# Patient Record
Sex: Female | Born: 1955 | Race: White | Hispanic: No | Marital: Single | Smoking: Never smoker
Health system: Southern US, Community
[De-identification: ages and names within clinical notes are randomized; demographics above are authoritative.]

## PROBLEM LIST (undated history)

## (undated) HISTORY — PX: TONSILLECTOMY: SUR1361

## (undated) HISTORY — PX: KNEE SURGERY: SHX244

## (undated) HISTORY — PX: WRIST SURGERY: SHX841

---

## 2020-12-03 ENCOUNTER — Emergency Department (HOSPITAL_BASED_OUTPATIENT_CLINIC_OR_DEPARTMENT_OTHER): Payer: 59

## 2020-12-03 ENCOUNTER — Other Ambulatory Visit: Payer: Self-pay

## 2020-12-03 ENCOUNTER — Encounter (HOSPITAL_BASED_OUTPATIENT_CLINIC_OR_DEPARTMENT_OTHER): Payer: Self-pay | Admitting: *Deleted

## 2020-12-03 ENCOUNTER — Emergency Department (HOSPITAL_BASED_OUTPATIENT_CLINIC_OR_DEPARTMENT_OTHER)
Admission: EM | Admit: 2020-12-03 | Discharge: 2020-12-03 | Disposition: A | Discharge status: Home / Self Care | Payer: 59 | Attending: Emergency Medicine | Admitting: Emergency Medicine

## 2020-12-03 DIAGNOSIS — W19XXXA Unspecified fall, initial encounter: Secondary | ICD-10-CM | POA: Diagnosis not present

## 2020-12-03 DIAGNOSIS — S60032A Contusion of left middle finger without damage to nail, initial encounter: Secondary | ICD-10-CM | POA: Insufficient documentation

## 2020-12-03 DIAGNOSIS — U071 COVID-19: Secondary | ICD-10-CM | POA: Insufficient documentation

## 2020-12-03 DIAGNOSIS — R55 Syncope and collapse: Secondary | ICD-10-CM | POA: Diagnosis present

## 2020-12-03 DIAGNOSIS — Y92002 Bathroom of unspecified non-institutional (private) residence single-family (private) house as the place of occurrence of the external cause: Secondary | ICD-10-CM | POA: Diagnosis not present

## 2020-12-03 DIAGNOSIS — S60022A Contusion of left index finger without damage to nail, initial encounter: Secondary | ICD-10-CM | POA: Diagnosis not present

## 2020-12-03 LAB — RESP PANEL BY RT-PCR (FLU A&B, COVID) ARPGX2
Influenza A by PCR: NEGATIVE
Influenza B by PCR: NEGATIVE
SARS Coronavirus 2 by RT PCR: POSITIVE — AB

## 2020-12-03 LAB — URINALYSIS, ROUTINE W REFLEX MICROSCOPIC
Bilirubin Urine: NEGATIVE
Glucose, UA: NEGATIVE mg/dL
Hgb urine dipstick: NEGATIVE
Ketones, ur: NEGATIVE mg/dL
Leukocytes,Ua: NEGATIVE
Nitrite: NEGATIVE
Protein, ur: NEGATIVE mg/dL
Specific Gravity, Urine: 1.005 (ref 1.005–1.030)
pH: 8.5 — ABNORMAL HIGH (ref 5.0–8.0)

## 2020-12-03 LAB — CBC
HCT: 42.6 % (ref 36.0–46.0)
Hemoglobin: 14 g/dL (ref 12.0–15.0)
MCH: 31.7 pg (ref 26.0–34.0)
MCHC: 32.9 g/dL (ref 30.0–36.0)
MCV: 96.4 fL (ref 80.0–100.0)
Platelets: 226 10*3/uL (ref 150–400)
RBC: 4.42 MIL/uL (ref 3.87–5.11)
RDW: 13.1 % (ref 11.5–15.5)
WBC: 9.1 10*3/uL (ref 4.0–10.5)
nRBC: 0 % (ref 0.0–0.2)

## 2020-12-03 LAB — TRIGLYCERIDES: Triglycerides: 40 mg/dL (ref <150)

## 2020-12-03 LAB — BASIC METABOLIC PANEL
Anion gap: 7 (ref 5–15)
BUN: 16 mg/dL (ref 8–23)
CO2: 27 mmol/L (ref 22–32)
Calcium: 8.8 mg/dL — ABNORMAL LOW (ref 8.9–10.3)
Chloride: 106 mmol/L (ref 98–111)
Creatinine, Ser: 0.88 mg/dL (ref 0.44–1.00)
GFR, Estimated: >60 mL/min (ref >60)
Glucose, Bld: 121 mg/dL — ABNORMAL HIGH (ref 70–99)
Potassium: 3.8 mmol/L (ref 3.5–5.1)
Sodium: 140 mmol/L (ref 135–145)

## 2020-12-03 LAB — FERRITIN: Ferritin: 68 ng/mL (ref 11–307)

## 2020-12-03 LAB — LACTATE DEHYDROGENASE: LDH: 167 U/L (ref 98–192)

## 2020-12-03 LAB — FIBRINOGEN: Fibrinogen: 417 mg/dL (ref 210–475)

## 2020-12-03 LAB — C-REACTIVE PROTEIN: CRP: 0.7 mg/dL (ref <1.0)

## 2020-12-03 LAB — D-DIMER, QUANTITATIVE: D-Dimer, Quant: 0.45 ug/mL-FEU (ref 0.00–0.50)

## 2020-12-03 LAB — PROCALCITONIN: Procalcitonin: <0.1 ng/mL

## 2020-12-03 NOTE — ED Notes (Signed)
+  COVID, results given to ED MD and Myia RN

## 2020-12-03 NOTE — ED Provider Notes (Signed)
F/u d-dimer, if negative, stable for discharge. Physical Exam  BP 109/78 (BP Location: Right Arm)   Pulse 80   Temp 99.4 F (37.4 C) (Oral)   Resp 20   Ht 5\' 5"  (1.651 m)   Wt 79.4 kg   SpO2 99%   BMI 29.12 kg/m   Physical Exam  ED Course/Procedures     Procedures  MDM  Patient alert and nontoxic.  No respiratory distress.  Normal vital signs.  No hypotension, hypoxia or tachycardia.  Patient does not have elevation in inflammatory markers to suggest more severe COVID.  At this time recommendations made for home treatment.  Message placed for remdesivir clinic.  Patient does not appear to have significant risk factors other than age.  She does report having had COVID December a year ago.  She is not vaccinated.       January, MD 12/03/20 [phone]

## 2020-12-03 NOTE — ED Provider Notes (Addendum)
[facility] EMERGENCY DEPARTMENT Provider Note   CSN: [id number] Arrival date & time: 12/03/20  0272     History Chief Complaint  Patient presents with   Angela Lor    Angela Kline is a 65 y.o. female.    65 year old female comes in with chief complaint of fall and questionable syncope. She has no medical history but does not see any physicians.  She reports having COVID-19 in December, 2020.   Earlier this morning patient fell in her bathroom.  She reports that she had urinated, and then washing her face -and the next thing she recalls she was on the floor.  From the fall itself she is having pain to her head, neck and hand.  She denies any numbness, tingling.  She has ambulated with mild discomfort over the hips.  Patient is not vaccinated.  Upon further history she reports that she had some sore throat, chills few days ago.  No history of PE, DVT.  No history of chest pain, palpitations.  Patient does not take any blood thinners.   History reviewed. No pertinent past medical history.  There are no problems to display for this patient.   Past Surgical History:  Procedure Laterality Date   KNEE SURGERY     TONSILLECTOMY     WRIST SURGERY Bilateral      OB History   No obstetric history on file.     History reviewed. No pertinent family history.  Social History   Tobacco Use   Smoking status: Never Smoker   Smokeless tobacco: Never Used  Substance Use Topics   Alcohol use: Not Currently   Drug use: Never    Home Medications Prior to Admission medications   Not on File    Allergies    Morphine and related and Penicillins  Review of Systems   Review of Systems  Constitutional: Positive for activity change and diaphoresis.  Respiratory: Negative for shortness of breath.   Cardiovascular: Negative for chest pain.  Allergic/Immunologic: Negative for immunocompromised state.  Neurological: Positive for syncope and headaches. Negative for  seizures.  Hematological: Does not bruise/bleed easily.  All other systems reviewed and are negative.   Physical Exam Updated Vital Signs BP 121/65    Pulse 67    Temp 99.4 F (37.4 C) (Oral)    Resp 20    Ht 5\' 5"  (1.651 m)    Wt 79.4 kg    SpO2 98%    BMI 29.12 kg/m   Physical Exam Vitals and nursing note reviewed.  Constitutional:      Appearance: She is well-developed.  HENT:     Head: Normocephalic and atraumatic.  Eyes:     Extraocular Movements: EOM normal.  Cardiovascular:     Rate and Rhythm: Normal rate.  Pulmonary:     Effort: Pulmonary effort is normal.  Abdominal:     General: Bowel sounds are normal.  Musculoskeletal:        General: Tenderness present. No swelling or deformity.     Cervical back: Normal range of motion and neck supple.     Comments: Tenderness over the second and third digit of the left hand, mild ecchymosis.  No deformity, patient is able to fire the intrinsic and extrinsic muscles of the hand without difficulty.  Skin:    General: Skin is warm and dry.  Neurological:     Mental Status: She is alert and oriented to person, place, and time.     ED Results / Procedures /  Treatments   Labs (all labs ordered are listed, but only abnormal results are displayed) Labs Reviewed  RESP PANEL BY RT-PCR (FLU A&B, COVID) ARPGX2 - Abnormal; Notable for the following components:      Result Value   SARS Coronavirus 2 by RT PCR POSITIVE (*)    All other components within normal limits  BASIC METABOLIC PANEL - Abnormal; Notable for the following components:   Glucose, Bld 121 (*)    Calcium 8.8 (*)    All other components within normal limits  URINALYSIS, ROUTINE W REFLEX MICROSCOPIC - Abnormal; Notable for the following components:   pH 8.5 (*)    All other components within normal limits  CBC  PROCALCITONIN  LACTATE DEHYDROGENASE  FERRITIN  TRIGLYCERIDES  C-REACTIVE PROTEIN  D-DIMER, QUANTITATIVE (NOT AT [facility])  FIBRINOGEN    EKG EKG  Interpretation  Date/Time:  Saturday December 03 2020 14:46:31 EST Ventricular Rate:  72 PR Interval:    QRS Duration: 94 QT Interval:  420 QTC Calculation: 460 R Axis:   52 Text Interpretation: Sinus rhythm Repol abnrm suggests ischemia, anterolateral No acute changes No significant change since last tracing Confirmed by Derwood Kaplan ([id number]) on 12/04/2020 3:30:08 PM   Radiology CT Head Wo Contrast  Result Date: 12/03/2020 CLINICAL DATA:  Fall this morning with hematoma to right eye. EXAM: CT HEAD WITHOUT CONTRAST CT CERVICAL SPINE WITHOUT CONTRAST TECHNIQUE: Multidetector CT imaging of the head and cervical spine was performed following the standard protocol without intravenous contrast. Multiplanar CT image reconstructions of the cervical spine were also generated. COMPARISON:  None. FINDINGS: CT HEAD FINDINGS Brain: Ventricles, cisterns and other CSF spaces are normal. There is no mass, mass effect, shift of midline structures or acute hemorrhage. No evidence of acute infarction. Vascular: No hyperdense vessel or unexpected calcification. Skull: Normal. Negative for fracture or focal lesion. Sinuses/Orbits: No acute finding. Other: Well-defined oval 2.3 cm mass over the high right posterior parietal scalp likely benign dermatologic lesion CT CERVICAL SPINE FINDINGS Alignment: Normal. Skull base and vertebrae: Vertebral body heights are normal. There is mild spondylosis throughout the cervical spine to include facet arthropathy and uncovertebral joint spurring. Atlantoaxial articulation is within normal. There is no evidence of acute fracture or traumatic subluxation. Soft tissues and spinal canal: No prevertebral fluid or swelling. No visible canal hematoma. Disc levels: Mild disc space narrowing at the C6-7 level and to lesser degree at the C3-4 level. Upper chest: No acute findings. Other: None. IMPRESSION: 1. No acute brain injury. 2. No acute cervical spine injury. 3. Mild spondylosis of the  cervical spine with mild disc disease at the C6-7 and C3-4 levels. Electronically Signed   By: Elberta Fortis M.D.   On: 12/03/2020 14:16   CT Cervical Spine Wo Contrast  Result Date: 12/03/2020 CLINICAL DATA:  Fall this morning with hematoma to right eye. EXAM: CT HEAD WITHOUT CONTRAST CT CERVICAL SPINE WITHOUT CONTRAST TECHNIQUE: Multidetector CT imaging of the head and cervical spine was performed following the standard protocol without intravenous contrast. Multiplanar CT image reconstructions of the cervical spine were also generated. COMPARISON:  None. FINDINGS: CT HEAD FINDINGS Brain: Ventricles, cisterns and other CSF spaces are normal. There is no mass, mass effect, shift of midline structures or acute hemorrhage. No evidence of acute infarction. Vascular: No hyperdense vessel or unexpected calcification. Skull: Normal. Negative for fracture or focal lesion. Sinuses/Orbits: No acute finding. Other: Well-defined oval 2.3 cm mass over the high right posterior parietal scalp likely benign dermatologic lesion  CT CERVICAL SPINE FINDINGS Alignment: Normal. Skull base and vertebrae: Vertebral body heights are normal. There is mild spondylosis throughout the cervical spine to include facet arthropathy and uncovertebral joint spurring. Atlantoaxial articulation is within normal. There is no evidence of acute fracture or traumatic subluxation. Soft tissues and spinal canal: No prevertebral fluid or swelling. No visible canal hematoma. Disc levels: Mild disc space narrowing at the C6-7 level and to lesser degree at the C3-4 level. Upper chest: No acute findings. Other: None. IMPRESSION: 1. No acute brain injury. 2. No acute cervical spine injury. 3. Mild spondylosis of the cervical spine with mild disc disease at the C6-7 and C3-4 levels. Electronically Signed   By: Elberta Fortis M.D.   On: 12/03/2020 14:16   DG Hand Complete Left  Result Date: 12/03/2020 CLINICAL DATA:  Left index and long finger pain due to  an injury suffered in a fall today. Initial encounter. EXAM: LEFT HAND - COMPLETE 3+ VIEW COMPARISON:  None. FINDINGS: There is no acute bony or joint abnormality. 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 The patient is status post plate and screw fixation of a distal radius fracture. Soft tissues are negative. IMPRESSION: No acute abnormality. Electronically Signed   By: Drusilla Kanner M.D.   On: 12/03/2020 14:10    Procedures Procedures (including critical care time)  Medications Ordered in ED Medications - No data to display  ED Course  I have reviewed the triage vital signs and the nursing notes.  Pertinent labs & imaging results that were available during my care of the patient were reviewed by me and considered in my medical decision making (see chart for details).    MDM Rules/Calculators/A&P                          DDx includes: Orthostatic hypotension Stroke Vertebral artery dissection/stenosis Dysrhythmia PE Vasovagal/neurocardiogenic syncope Aortic stenosis Valvular disorder/Cardiomyopathy Anemia  Patient comes in a chief complaint of syncope.  She has no medical history.  She did have COVID-07 November 2019, which does put her at high risk of having PE.  She denies any chest pain, palpitations, shortness of breath.  Of note, review of system was positive for sore throat, chills and diaphoresis 3 or 4 days ago.  I am concerned that she has acquired COVID-19 again.   From trauma perspective, x-rays and CT scan ordered and they are negative.  D-dimer along with COVID-19 test and inflammatory marker sent.  Patient's care assumed by incoming team.  Driving restrictions for 6 months discussed. Patient now has insurance, advised to follow-up with PCP.  Cardiology phone number to be provided for syncope work-up. Final Clinical Impression(s) / ED Diagnoses Final diagnoses:  Syncope and collapse  COVID    Rx / DC Orders ED Discharge  Orders    None          Derwood Kaplan, MD 12/04/20 1538

## 2020-12-03 NOTE — Discharge Instructions (Addendum)
He was seen in the ER for syncope/fainting.  Please follow-up with the cardiology service for further work-up.  We have informed our cardiology service to get in touch with you for an appointment -but if you do not hear from them by Monday evening call the number provided.  Return to the ER if you have another fainting.  You have tested positive for COVID.  A request has been placed to the remdesivir clinic for evaluation.  You may receive a call regarding possible further treatment.  Call your family doctor to schedule a recheck within the next 2 to 4 days.  Try to stay hydrated, take acetaminophen or ibuprofen for fever and body aches.  Return to the emergency department if you develop chest pain, shortness of breath, increasing weakness or other concerning symptoms.

## 2020-12-03 NOTE — ED Triage Notes (Signed)
Fall this morning after standing from the toilet. Hematoma about right eye.  Pt does not remember falling. Also reports left hand pain. Pt is not on a blood thinner. Neck pain-c-collar applied in triage.  Sore throat, weakness, abdominal pain x several days.

## 2020-12-03 NOTE — ED Notes (Signed)
Blue top redrawn using butterfly and sent to the lab.  Tolerated well.

## 2020-12-04 ENCOUNTER — Other Ambulatory Visit: Payer: Self-pay | Admitting: Unknown Physician Specialty

## 2020-12-04 ENCOUNTER — Telehealth: Payer: Self-pay | Admitting: Unknown Physician Specialty

## 2020-12-04 DIAGNOSIS — U071 COVID-19: Secondary | ICD-10-CM

## 2020-12-04 DIAGNOSIS — E663 Overweight: Secondary | ICD-10-CM

## 2020-12-04 NOTE — Telephone Encounter (Signed)
I connected by phone with Angela Kline on 12/04/2020 at 9:20 AM to discuss the potential use of a new treatment for mild to moderate COVID-19 viral infection in non-hospitalized patients.  This patient is a 65 y.o. female that meets the FDA criteria for Emergency Use Authorization of COVID monoclonal antibody sotrovimab.  Has a (+) direct SARS-CoV-2 viral test result  Has mild or moderate COVID-19   Is NOT hospitalized due to COVID-19  Is within 10 days of symptom onset  Has at least one of the high risk factor(s) for progression to severe COVID-19 and/or hospitalization as defined in EUA.  Specific high risk criteria : BMI > 25   I have spoken and communicated the following to the patient or parent/caregiver regarding COVID monoclonal antibody treatment:  1. FDA has authorized the emergency use for the treatment of mild to moderate COVID-19 in adults and pediatric patients with positive results of direct SARS-CoV-2 viral testing who are 23 years of age and older weighing at least 40 kg, and who are at high risk for progressing to severe COVID-19 and/or hospitalization.  2. The significant known and potential risks and benefits of COVID monoclonal antibody, and the extent to which such potential risks and benefits are unknown.  3. Information on available alternative treatments and the risks and benefits of those alternatives, including clinical trials.  4. Patients treated with COVID monoclonal antibody should continue to self-isolate and use infection control measures (e.g., wear mask, isolate, social distance, avoid sharing personal items, clean and disinfect "high touch" surfaces, and frequent handwashing) according to CDC guidelines.   5. The patient or parent/caregiver has the option to accept or refuse COVID monoclonal antibody treatment.  After reviewing this information with the patient, the patient has agreed to receive one of the available covid 19 monoclonal antibodies and  will be provided an appropriate fact sheet prior to infusion. Gabriel Cirri, NP 12/04/2020 9:20 AM  Sx onset 1/14

## 2020-12-05 ENCOUNTER — Telehealth: Payer: Self-pay | Admitting: Adult Health

## 2020-12-05 NOTE — Telephone Encounter (Signed)
Called patient to confirm date/time of treatment.  Patient informed me that she would like to cancel her treatment because she is feeling better.  We reviewed risks/benefits and she is aware of them.  I canceled her appt, and wished her a continued recovery.    Lillard Anes, NP

## 2021-06-11 IMAGING — CT CT HEAD W/O CM
3 series · 14 of 47 positions shown, 16 images · non-contrast
Comparison: None.

CLINICAL DATA: Fall this morning with hematoma to right eye.

EXAM:
CT HEAD WITHOUT CONTRAST
CT CERVICAL SPINE WITHOUT CONTRAST
TECHNIQUE: Multidetector CT imaging of the head and cervical spine was
performed following the standard protocol without intravenous
contrast. Multiplanar CT image reconstructions of the cervical spine
were also generated.

[Series 5: head 3.0 mpr cor · coronal · 0.37mm/px · 3 of 77 slices shown]
[im 26/77  brain]
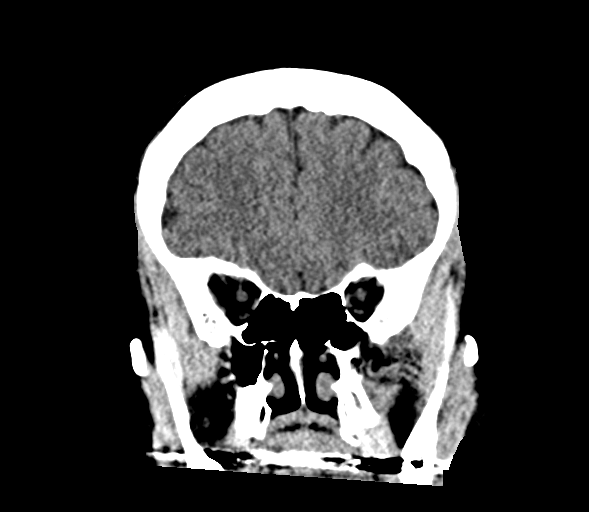
[im 34/77  brain]
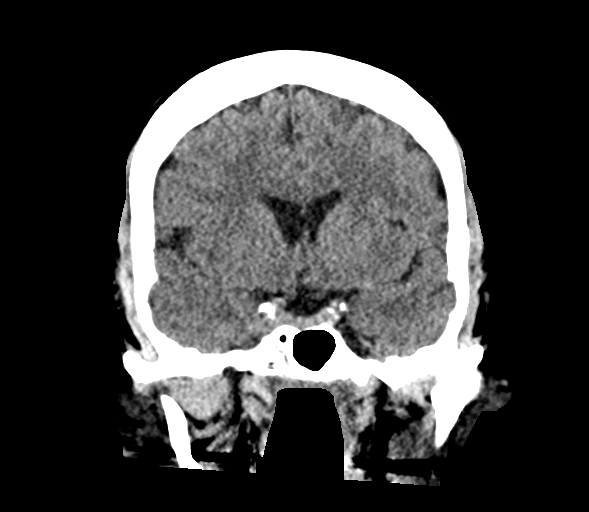
[im 43/77  brain]
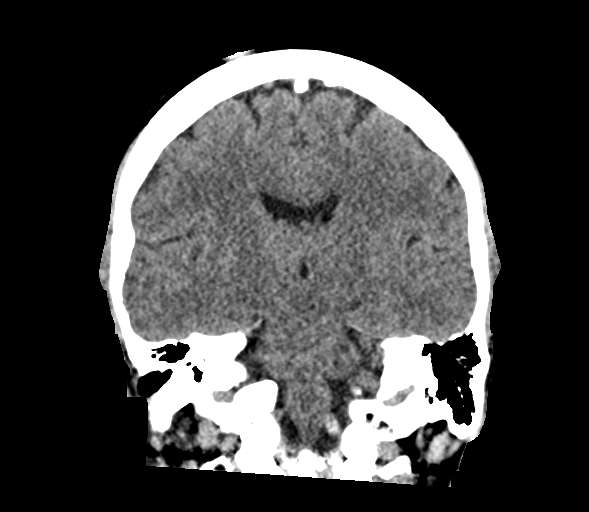

[Series 6: head 5.0 mpr ax · axial · 0.38mm/px · z∈[-663,-534]mm · 8 of 32 slices shown, 10 images]
[im 3/32  brain]
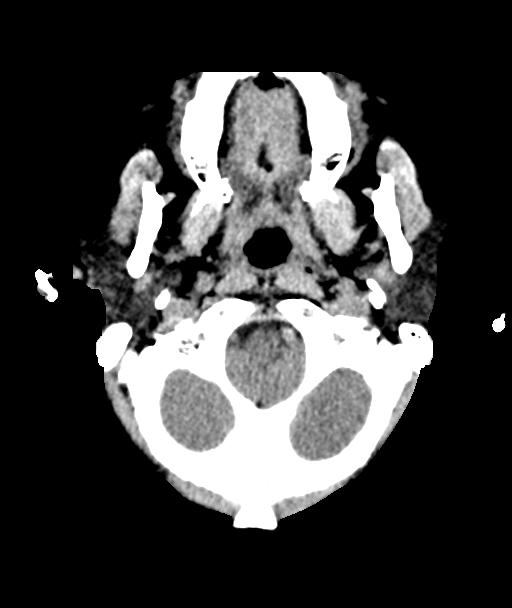
[im 3/32  bone]
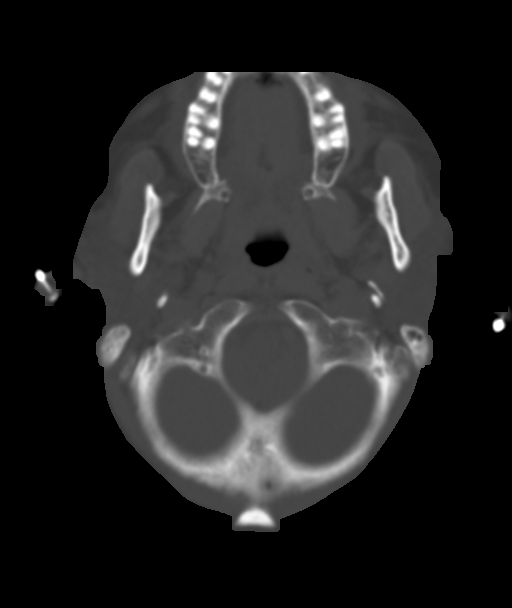
[im 7/32  brain]
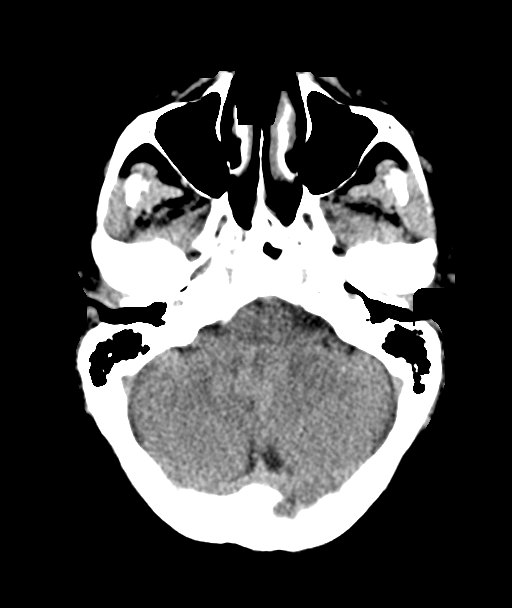
[im 10/32  brain]
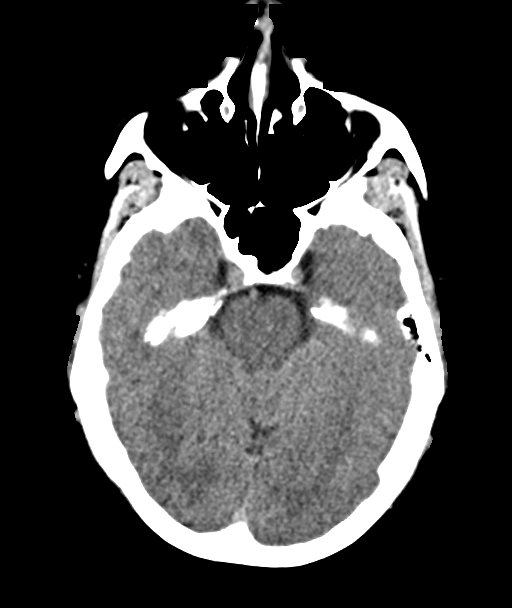
[im 14/32  brain]
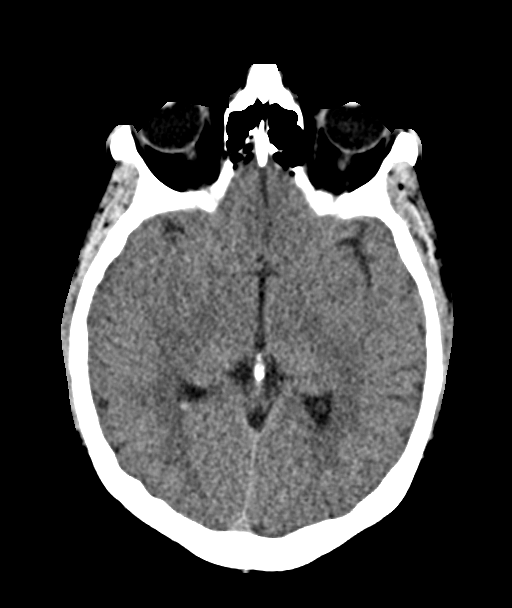
[im 18/32  brain]
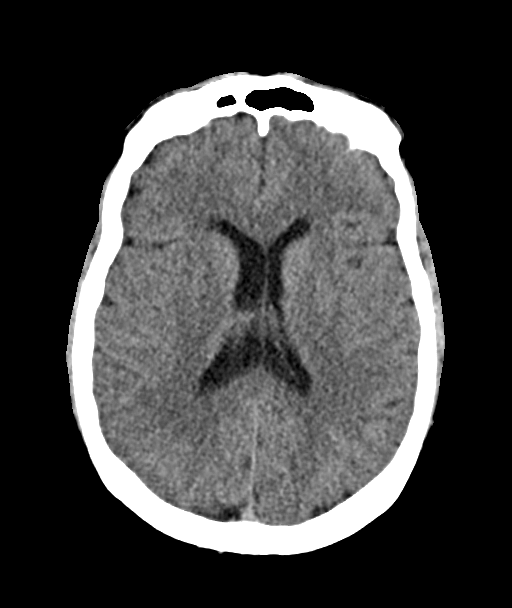
[im 18/32  bone]
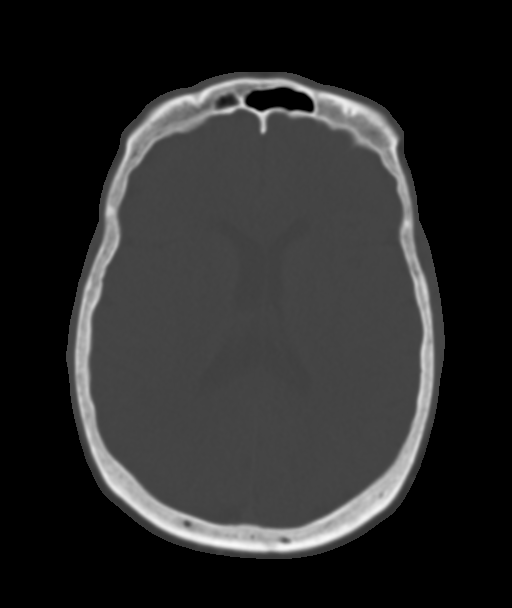
[im 22/32  brain]
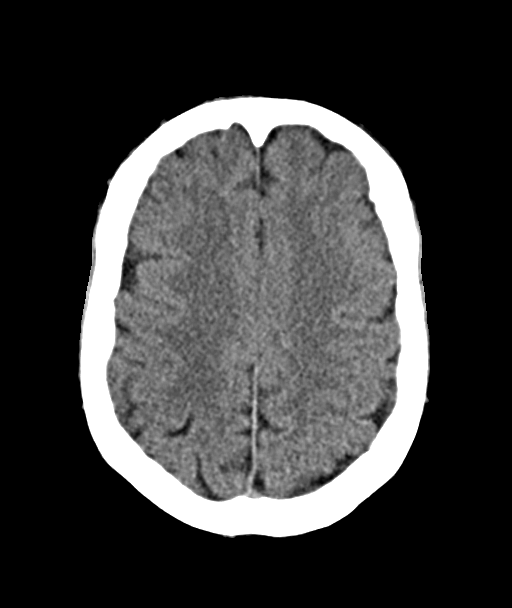
[im 25/32  brain]
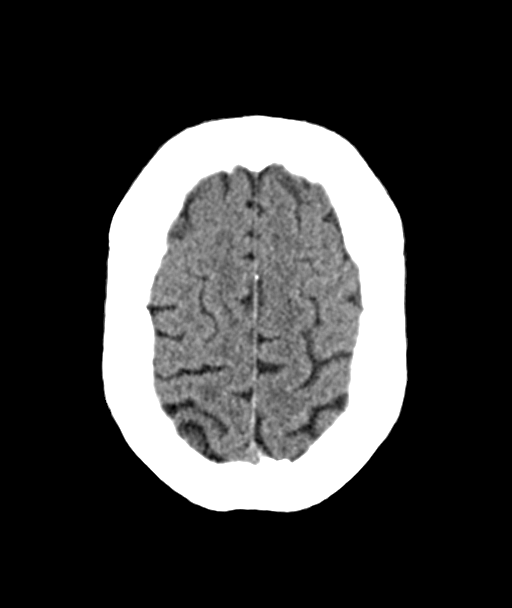
[im 29/32  brain]
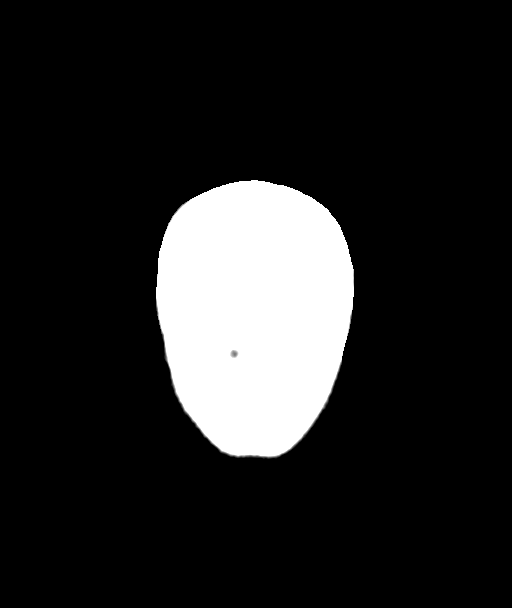

[Series 7: head 3.0 mpr sag · sagittal · 0.34mm/px · 3 of 60 slices shown]
[im 20/60  brain]
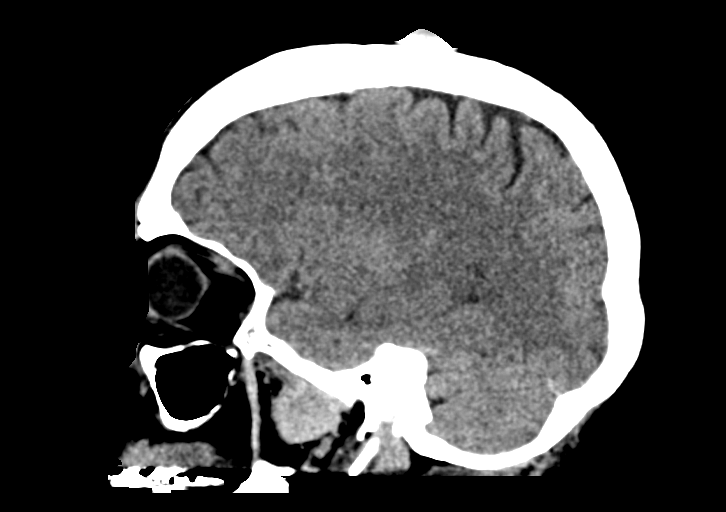
[im 30/60  brain]
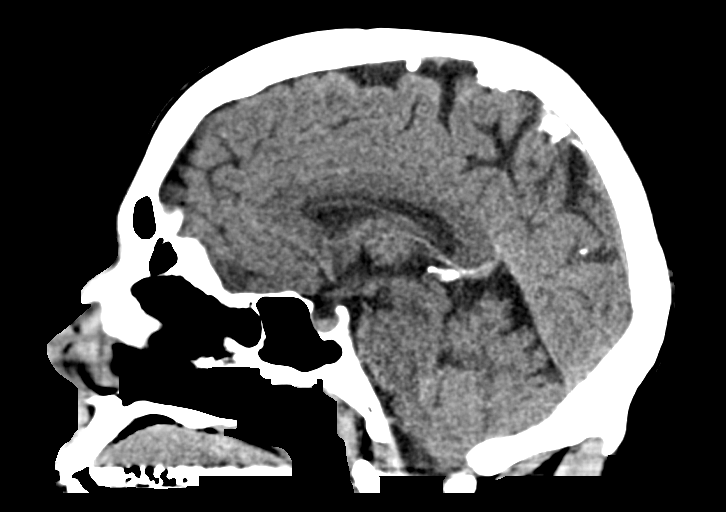
[im 40/60  brain]
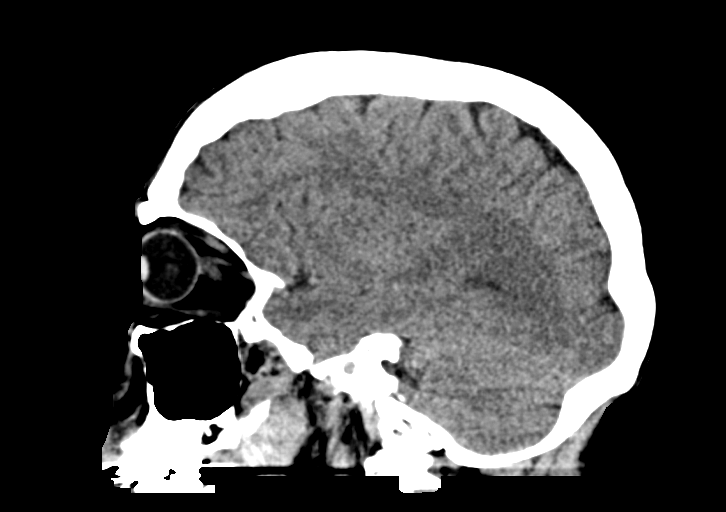

[14 of 47 positions shown; findings below may reference images not displayed]

FINDINGS: CT HEAD FINDINGS

Brain: Ventricles, cisterns and other CSF spaces are normal. There
is no mass, mass effect, shift of midline structures or acute
hemorrhage. No evidence of acute infarction.

Vascular: No hyperdense vessel or unexpected calcification.

Skull: Normal. Negative for fracture or focal lesion.

Sinuses/Orbits: No acute finding.

Other: Well-defined oval 2.3 cm mass over the high right posterior
parietal scalp likely benign dermatologic lesion

CT CERVICAL SPINE FINDINGS

Alignment: Normal.

Skull base and vertebrae: Vertebral body heights are normal. There
is mild spondylosis throughout the cervical spine to include facet
arthropathy and uncovertebral joint spurring. Atlantoaxial
articulation is within normal. There is no evidence of acute
fracture or traumatic subluxation.

Soft tissues and spinal canal: No prevertebral fluid or swelling. No
visible canal hematoma.

Disc levels: Mild disc space narrowing at the C6-7 level and to
lesser degree at the C3-4 level.

Upper chest: No acute findings.

Other: None.
IMPRESSION: 1. No acute brain injury.
2. No acute cervical spine injury.
3. Mild spondylosis of the cervical spine with mild disc disease at
the C6-7 and C3-4 levels.

## 2021-06-11 IMAGING — CT CT CERVICAL SPINE W/O CM
3 of 4 series · 12 of 33 positions shown, 14 images · non-contrast
Comparison: None.

CLINICAL DATA: Fall this morning with hematoma to right eye.

EXAM:
CT HEAD WITHOUT CONTRAST
CT CERVICAL SPINE WITHOUT CONTRAST
TECHNIQUE: Multidetector CT imaging of the head and cervical spine was
performed following the standard protocol without intravenous
contrast. Multiplanar CT image reconstructions of the cervical spine
were also generated.

[Series 3: c_spine 2.0 i30s 3 · axial · 0.29mm/px · z∈[-766,-662]mm · 4 of 79 slices shown, 5 images]
[im 14/79  soft-tissue]
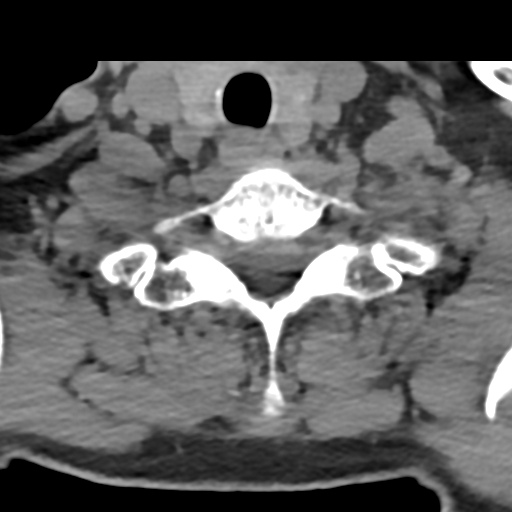
[im 14/79  bone]
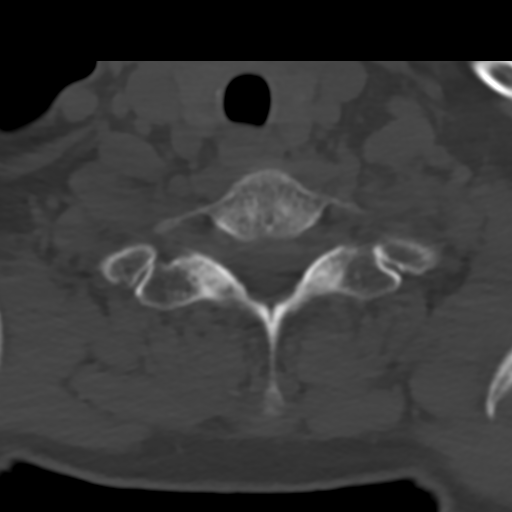
[im 27/79  bone]
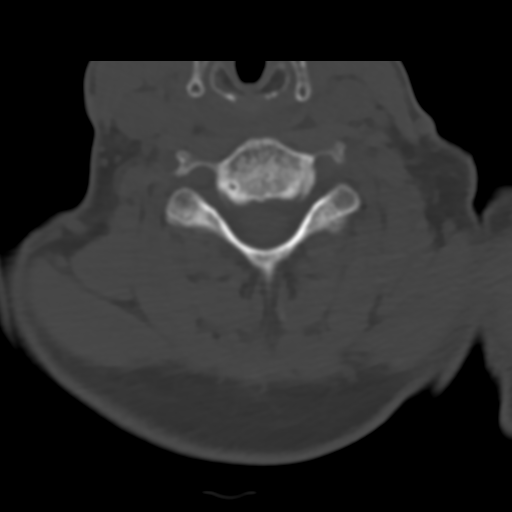
[im 53/79  bone]
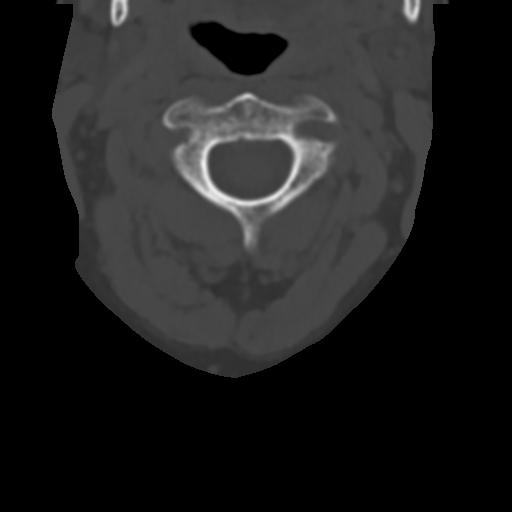
[im 66/79  bone]
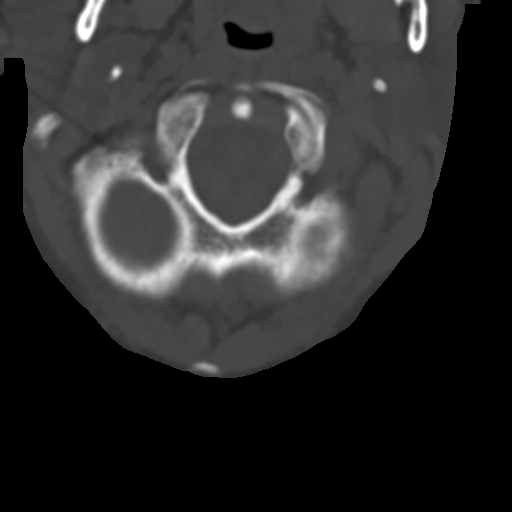

[Series 5: coronals · coronal · 0.29mm/px · 3 of 47 slices shown]
[im 10/47  bone]
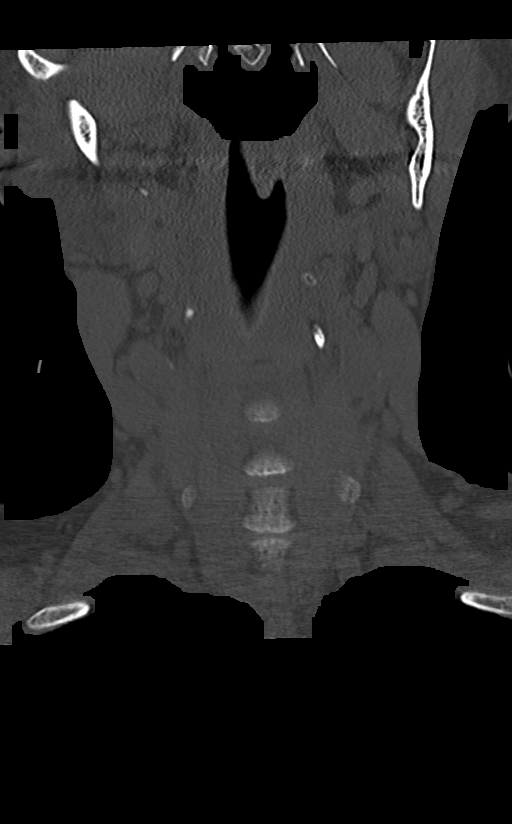
[im 19/47  bone]
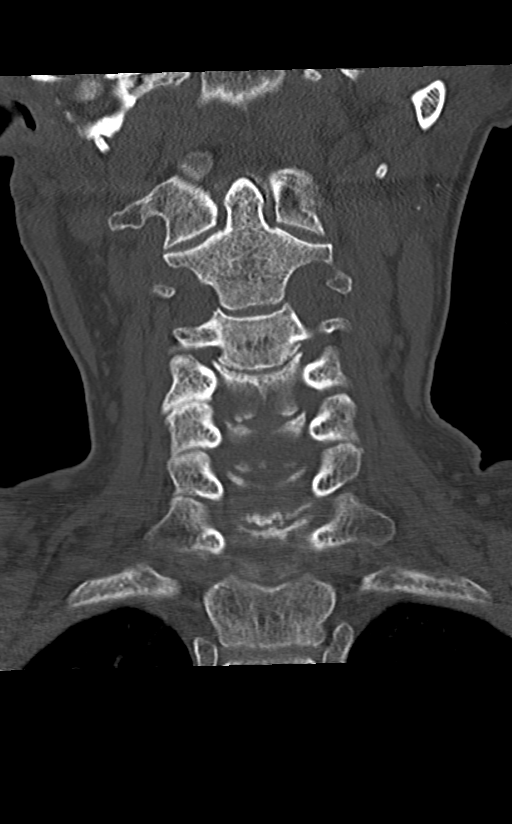
[im 28/47  bone]
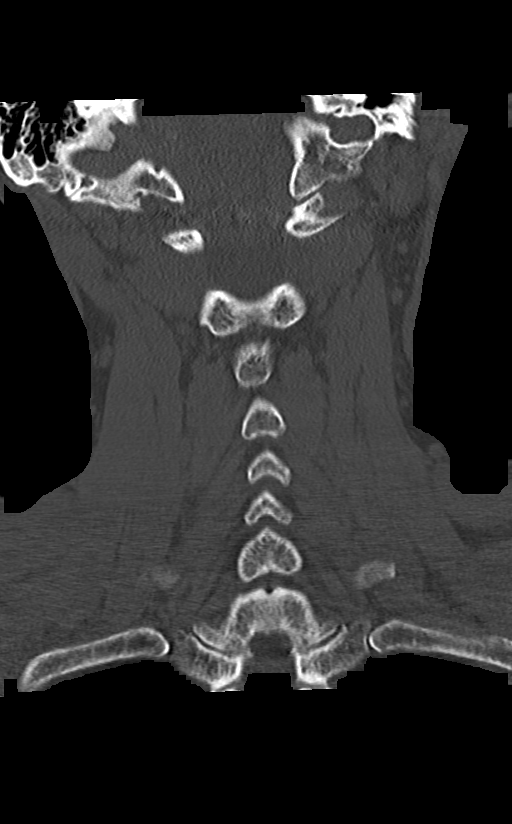

[Series 6: sagittals · sagittal · 0.26mm/px · 5 of 56 slices shown, 6 images]
[im 19/56  bone]
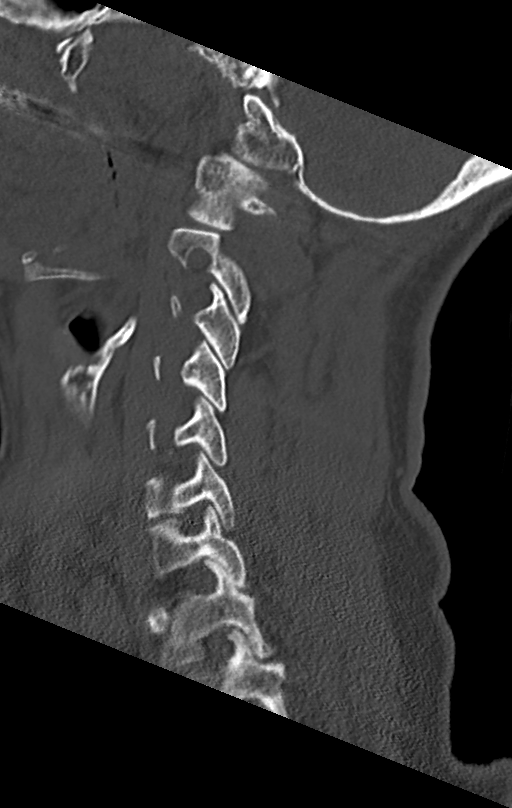
[im 23/56  bone]
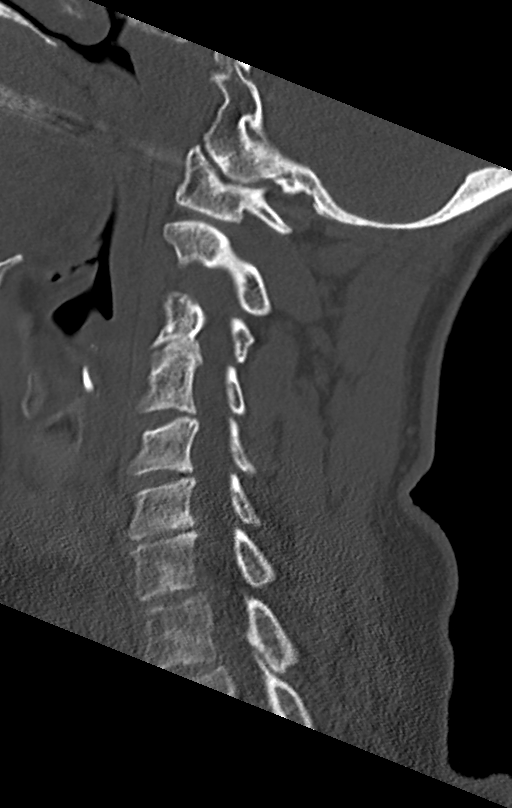
[im 28/56  soft-tissue]
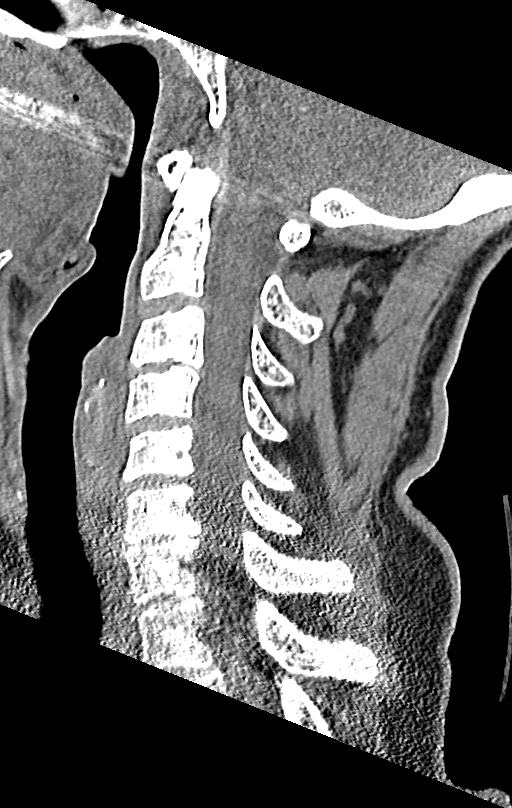
[im 28/56  bone]
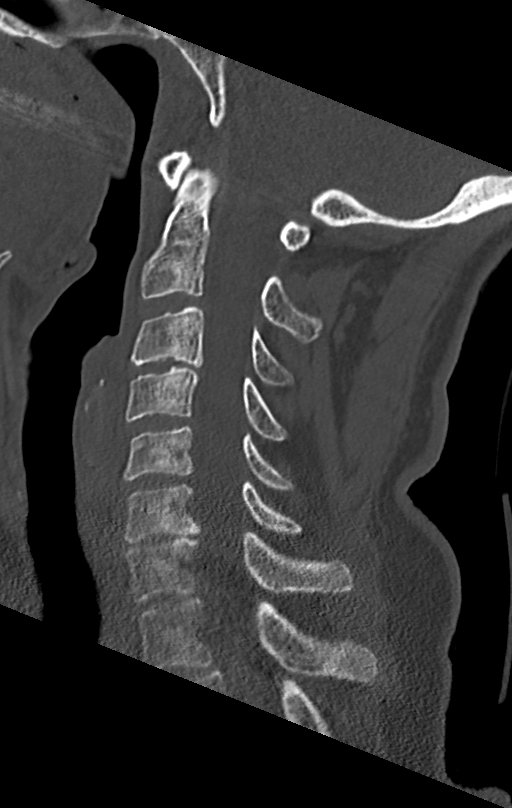
[im 33/56  bone]
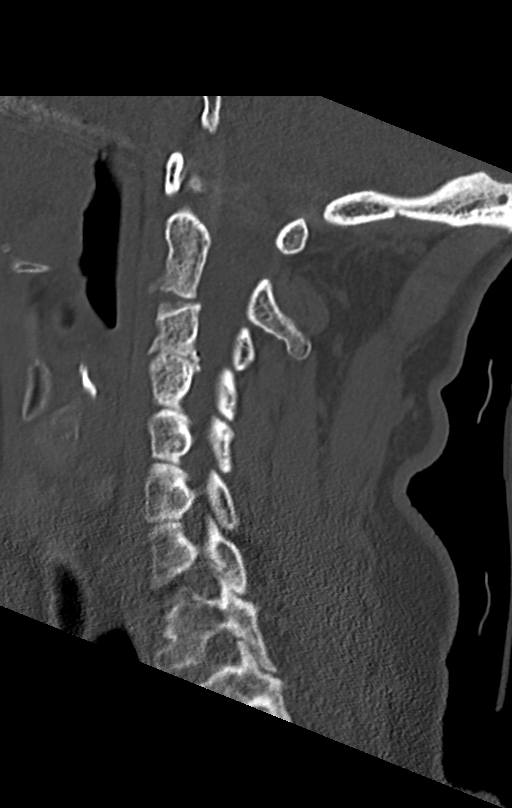
[im 37/56  bone]
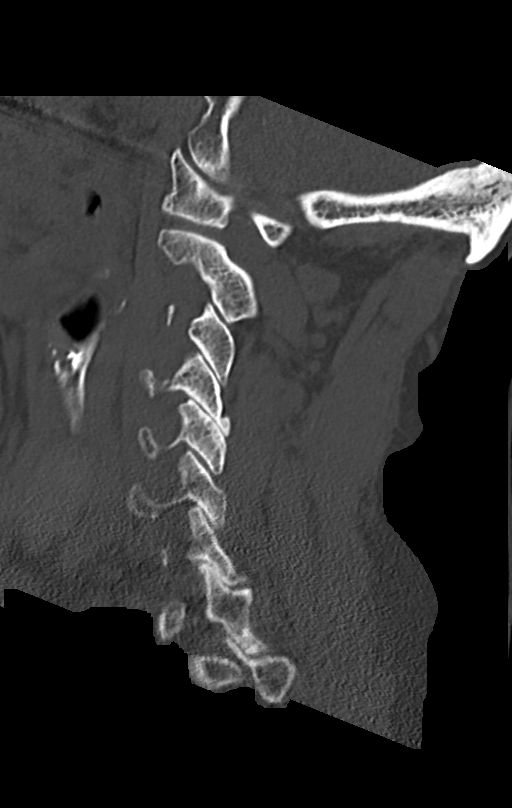

[12 of 33 positions shown; findings below may reference images not displayed]

FINDINGS: CT HEAD FINDINGS

Brain: Ventricles, cisterns and other CSF spaces are normal. There
is no mass, mass effect, shift of midline structures or acute
hemorrhage. No evidence of acute infarction.

Vascular: No hyperdense vessel or unexpected calcification.

Skull: Normal. Negative for fracture or focal lesion.

Sinuses/Orbits: No acute finding.

Other: Well-defined oval 2.3 cm mass over the high right posterior
parietal scalp likely benign dermatologic lesion

CT CERVICAL SPINE FINDINGS

Alignment: Normal.

Skull base and vertebrae: Vertebral body heights are normal. There
is mild spondylosis throughout the cervical spine to include facet
arthropathy and uncovertebral joint spurring. Atlantoaxial
articulation is within normal. There is no evidence of acute
fracture or traumatic subluxation.

Soft tissues and spinal canal: No prevertebral fluid or swelling. No
visible canal hematoma.

Disc levels: Mild disc space narrowing at the C6-7 level and to
lesser degree at the C3-4 level.

Upper chest: No acute findings.

Other: None.
IMPRESSION: 1. No acute brain injury.
2. No acute cervical spine injury.
3. Mild spondylosis of the cervical spine with mild disc disease at
the C6-7 and C3-4 levels.

## 2022-06-11 ENCOUNTER — Emergency Department (HOSPITAL_COMMUNITY)
Admission: EM | Admit: 2022-06-11 | Discharge: 2022-06-11 | Disposition: A | Discharge status: Home / Self Care | Payer: 59 | Attending: Emergency Medicine | Admitting: Emergency Medicine

## 2022-06-11 ENCOUNTER — Emergency Department (HOSPITAL_COMMUNITY): Payer: 59

## 2022-06-11 DIAGNOSIS — R0781 Pleurodynia: Secondary | ICD-10-CM | POA: Diagnosis not present

## 2022-06-11 DIAGNOSIS — W010XXA Fall on same level from slipping, tripping and stumbling without subsequent striking against object, initial encounter: Secondary | ICD-10-CM | POA: Insufficient documentation

## 2022-06-11 DIAGNOSIS — W19XXXA Unspecified fall, initial encounter: Secondary | ICD-10-CM

## 2022-06-11 DIAGNOSIS — M79671 Pain in right foot: Secondary | ICD-10-CM | POA: Diagnosis present

## 2022-06-11 NOTE — ED Provider Triage Note (Signed)
Emergency Medicine Provider Triage Evaluation Note  Angela Kline , a 66 y.o. female  was evaluated in triage.  Pt complains of right rib pain.  Patient slipped in her shower last night was seen [facility] had her right foot and ankle assessed and cared for however they were unable to complete a rib assessment there.  She denies significant shortness of breath.  Review of Systems  Positive: Rib injury Negative: Shortness of breath  Physical Exam  BP (!) 145/71 (BP Location: Right Arm)   Pulse (!) 53   Temp 98.3 F (36.8 C) (Oral)   Resp 16   SpO2 97%  Gen:   Awake, no distress   Resp:  Normal effort  MSK:   Moves extremities without difficulty  Other:  Tender to palpation right rib  Medical Decision Making  Medically screening exam initiated at 1:23 PM.  Appropriate orders placed.  [name] was informed that the remainder of the evaluation will be completed by another provider, this initial triage assessment does not replace that evaluation, and the importance of remaining in the ED until their evaluation is complete.  Work-up initiated   Arthor Captain, PA-C 06/11/22 1324

## 2022-06-11 NOTE — Discharge Instructions (Signed)
You were seen today due to fall.  The x-ray of your ribs does not show any fractures to the ribs.  Your lungs are also clear.  You may have some pain to the muscle wall, this should improve with time.  Follow-up with orthopedics regarding the right foot/ankle pain.  Return to the ED if you have new symptoms.

## 2022-06-11 NOTE — ED Notes (Signed)
Pt not answering to be roomed .  

## 2022-06-11 NOTE — ED Triage Notes (Signed)
Patient tripped and fell last night and here w/ R foot and R rib pain.  No LOC, no blood thinners. [facility]

## 2022-06-11 NOTE — ED Provider Notes (Signed)
[facility] EMERGENCY DEPARTMENT Provider Note   CSN: [id number] Arrival date & time: 06/11/22  1241     History  Chief Complaint  Patient presents with   Marletta Lor    Angela Kline is a 66 y.o. female.   Fall    Patient presents due to mechanical fall last night.  Patient did not hit her head or lose consciousness, seen at [facility] earlier today for right foot pain and right rib pain.  They worked up the ankle, stated patient should go to ED for better x-rays of the ribs.  The pain is to the right side of her chest wall, sometimes is worse with breathing and movement other times it is just there at rest.  She is not on any blood thinners, denies any pain elsewhere.  Home Medications Prior to Admission medications   Not on File      Allergies    Morphine and related and Penicillins    Review of Systems   Review of Systems  Physical Exam Updated Vital Signs BP (!) 145/71 (BP Location: Right Arm)   Pulse (!) 53   Temp 98.3 F (36.8 C) (Oral)   Resp 16   SpO2 97%  Physical Exam Vitals and nursing note reviewed. Exam conducted with a chaperone present.  Constitutional:      Appearance: Normal appearance.  HENT:     Head: Normocephalic and atraumatic.  Eyes:     General: No scleral icterus.       Right eye: No discharge.        Left eye: No discharge.     Extraocular Movements: Extraocular movements intact.     Pupils: Pupils are equal, round, and reactive to light.  Cardiovascular:     Rate and Rhythm: Normal rate and regular rhythm.     Pulses: Normal pulses.     Heart sounds: Normal heart sounds. No murmur heard.    No friction rub. No gallop.  Pulmonary:     Effort: Pulmonary effort is normal. No respiratory distress.     Breath sounds: Normal breath sounds.     Comments: Lung sounds present and equal bilaterally Musculoskeletal:        General: Tenderness present.     Comments: Tenderness to right lower ribs.  No can contusion,  crepitus.  Skin:    General: Skin is warm and dry.     Coloration: Skin is not jaundiced.  Neurological:     Mental Status: She is alert. Mental status is at baseline.     Coordination: Coordination normal.     ED Results / Procedures / Treatments   Labs (all labs ordered are listed, but only abnormal results are displayed) Labs Reviewed - No data to display  EKG None  Radiology DG Ribs Unilateral W/Chest Right  Result Date: 06/11/2022 CLINICAL DATA:  Trauma, fall, pain EXAM: RIGHT RIBS AND CHEST - 3+ VIEW COMPARISON:  None Available. FINDINGS: No displaced fractures are seen in right ribs. No focal pulmonary infiltrates are seen. There is no pleural effusion or pneumothorax. Left hemidiaphragm is elevated. IMPRESSION: No displaced fracture is seen in right ribs. No active disease is seen in the chest. Electronically Signed   By: Ernie Avena M.D.   On: 06/11/2022 13:55    Procedures Procedures    Medications Ordered in ED Medications - No data to display  ED Course/ Medical Decision Making/ A&P  Medical Decision Making  This is a 66 year old female presenting due to right rib pain.  On exam her lungs are clear to auscultation bilaterally with lung sounds present in each field.  No contusion or crepitus to the chest wall, no lacerations or abrasions.  Patient vital signs are reassuring, no hypoxia or tachypnea.  I ordered and viewed plain film of chest with right ribs.  No rib fractures or pneumothorax noted.  Considered CT chest but based on physical exam I do not think indicated.  Patient will follow-up with Ortho regarding foot pain.  Discharged in stable condition        Final Clinical Impression(s) / ED Diagnoses Final diagnoses:  Fall, initial encounter    Rx / DC Orders ED Discharge Orders     None         Theron Arista, Cordelia Poche 06/11/22 Gillermina Hu, MD 06/12/22 [phone]
